# Patient Record
Sex: Male | Born: 1990 | Race: White | Hispanic: No | Marital: Married | Smoking: Never smoker
Health system: Southern US, Community
[De-identification: ages and names within clinical notes are randomized; demographics above are authoritative.]

## PROBLEM LIST (undated history)

## (undated) HISTORY — PX: TONSILLECTOMY: SUR1361

---

## 2016-03-17 ENCOUNTER — Encounter (HOSPITAL_COMMUNITY): Payer: Self-pay | Admitting: *Deleted

## 2016-03-17 ENCOUNTER — Emergency Department (HOSPITAL_COMMUNITY): Payer: Managed Care, Other (non HMO)

## 2016-03-17 ENCOUNTER — Emergency Department (HOSPITAL_COMMUNITY)
Admission: EM | Admit: 2016-03-17 | Discharge: 2016-03-17 | Disposition: A | Discharge status: Home / Self Care | Payer: Managed Care, Other (non HMO) | Attending: Emergency Medicine | Admitting: Emergency Medicine

## 2016-03-17 DIAGNOSIS — Z91040 Latex allergy status: Secondary | ICD-10-CM | POA: Insufficient documentation

## 2016-03-17 DIAGNOSIS — W230XXA Caught, crushed, jammed, or pinched between moving objects, initial encounter: Secondary | ICD-10-CM | POA: Diagnosis not present

## 2016-03-17 DIAGNOSIS — Y939 Activity, unspecified: Secondary | ICD-10-CM | POA: Diagnosis not present

## 2016-03-17 DIAGNOSIS — Y929 Unspecified place or not applicable: Secondary | ICD-10-CM | POA: Diagnosis not present

## 2016-03-17 DIAGNOSIS — Y999 Unspecified external cause status: Secondary | ICD-10-CM | POA: Insufficient documentation

## 2016-03-17 DIAGNOSIS — S61216A Laceration without foreign body of right little finger without damage to nail, initial encounter: Secondary | ICD-10-CM | POA: Insufficient documentation

## 2016-03-17 DIAGNOSIS — S61219A Laceration without foreign body of unspecified finger without damage to nail, initial encounter: Secondary | ICD-10-CM

## 2016-03-17 DIAGNOSIS — S6991XA Unspecified injury of right wrist, hand and finger(s), initial encounter: Secondary | ICD-10-CM | POA: Diagnosis present

## 2016-03-17 MED ORDER — LIDOCAINE HCL (PF) 1 % IJ SOLN
5.0000 mL | Freq: Once | INTRAMUSCULAR | Status: DC
  Filled 2016-03-17: qty 5

## 2016-03-17 NOTE — ED Triage Notes (Signed)
Pt reports injuring right little finger when moving a refrigerator today. Has laceration to anterior finger, bleeding controlled and bandage was applied at an ucc pta. Pt reports able to move digit and denies any numbness but was sent here for further eval and possible hand consult.

## 2016-03-17 NOTE — ED Provider Notes (Signed)
[facility]Y DEPT Provider Note   CSN: [id number] Arrival date & time: 03/17/16  1426     History   Chief Complaint Chief Complaint  Patient presents with  . Hand Injury    HPI Reginald Mack is a 25 y.o. male.  The history is provided by the patient and the spouse.  Hand Pain  This is a new problem. The current episode started 1 to 2 hours ago. The problem occurs constantly. The problem has not changed since onset.The symptoms are aggravated by bending. The symptoms are relieved by rest. He has tried nothing for the symptoms.    History reviewed. No pertinent past medical history.  There are no active problems to display for this patient.   History reviewed. No pertinent surgical history.     Home Medications    Prior to Admission medications   Not on File    Family History History reviewed. No pertinent family history.  Social History Social History  Substance Use Topics  . Smoking status: Never Smoker  . Smokeless tobacco: Not on file  . Alcohol use No     Allergies   Latex   Review of Systems Review of Systems  All other systems reviewed and are negative.    Physical Exam Updated Vital Signs BP 118/57   Pulse 66   Temp 98.1 F (36.7 C) (Oral)   Resp 18   SpO2 100%   Physical Exam  Constitutional: He is oriented to person, place, and time. He appears well-developed and well-nourished. No distress.  Pleasant, cooperative, well-appearing  HENT:  Head: Normocephalic and atraumatic.  Eyes: Conjunctivae are normal. No scleral icterus.  Neck: Neck supple.  Cardiovascular: Intact distal pulses.   2+ right radial pulse  Pulmonary/Chest: Effort normal.  Abdominal: He exhibits no distension.  Musculoskeletal: Normal range of motion. He exhibits no edema, tenderness or deformity.  V-shaped irregular hemostatic laceration through dermis of right palmar 5th digit between PIP and DIP, without Fb, no muscular, nerve, or vessel involvement.  Full intact sensation throughout right hand, including 5th digit. Full intact ROM with medial and lateral movements, flexion/extension at MCP, PIP, and DIP. Brisk cap refill distally.  Neurological: He is alert and oriented to person, place, and time. He exhibits normal muscle tone. Coordination normal.  Skin: Skin is warm and dry. He is not diaphoretic.  Psychiatric: He has a normal mood and affect.  Nursing note and vitals reviewed.    ED Treatments / Results  Labs (all labs ordered are listed, but only abnormal results are displayed) Labs Reviewed - No data to display  EKG  EKG Interpretation None       Radiology Dg Finger Little Right  Result Date: 03/17/2016 CLINICAL DATA:  Slammed finger in refrigerator door EXAM: RIGHT FIFTH FINGER 2+V COMPARISON:  None. FINDINGS: Frontal, oblique, and lateral views were obtained. There is soft tissue injury medial to the fifth PIP joint. No fracture or dislocation. Joint spaces appear normal. No erosive change. IMPRESSION: Soft tissue injury medial to the fifth PIP joint. No fracture or dislocation. No evident arthropathy. Electronically Signed   By: Bretta Bang III M.D.   On: 03/17/2016 17:39    Procedures .Marland KitchenLaceration Repair Date/Time: 03/18/2016 12:59 AM Performed by: Horald Pollen Authorized by: Horald Pollen   Consent:    Consent obtained:  Verbal   Consent given by:  Patient   Risks discussed:  Infection and pain   Alternatives discussed:  No treatment Anesthesia (see MAR for exact dosages):  Anesthesia method:  Nerve block   Block needle gauge:  25 G   Block anesthetic:  Lidocaine 1% w/o epi   Block technique:  Block of 5th digit at base   Block injection procedure:  Anatomic landmarks identified, introduced needle, incremental injection, negative aspiration for blood and anatomic landmarks palpated   Block outcome:  Anesthesia achieved Laceration details:    Location:  Finger   Finger location:  R small  finger   Length (cm):  3   Depth (mm):  0.3 Repair type:    Repair type:  Simple Pre-procedure details:    Preparation:  Patient was prepped and draped in usual sterile fashion Exploration:    Wound extent: no fascia violation noted, no foreign bodies/material noted, no muscle damage noted, no nerve damage noted, no tendon damage noted, no underlying fracture noted and no vascular damage noted   Treatment:    Area cleansed with:  Saline   Amount of cleaning:  Standard   Irrigation solution:  Sterile saline   Irrigation volume:  150cc   Irrigation method:  Syringe and pressure wash Skin repair:    Repair method:  Sutures   Suture size:  4-0   Suture material:  Chromic gut   Suture technique:  Simple interrupted   Number of sutures:  4 Approximation:    Approximation:  Loose Post-procedure details:    Dressing:  Antibiotic ointment and non-adherent dressing   Patient tolerance of procedure:  Tolerated well, no immediate complications   (including critical care time)  Medications Ordered in ED Medications - No data to display   Initial Impression / Assessment and Plan / ED Course  I have reviewed the triage vital signs and the nursing notes.  Pertinent labs & imaging results that were available during my care of the patient were reviewed by me and considered in my medical decision making (see chart for details).  Clinical Course   Reginald Mack is a 25 y.o. male who presented to ED for 5th digit laceration as described above that occurred while moving a refridgerator today. Tetanus UTD (2015). Neuromuscularly intact. XR confirms no fx's. Laceration irrigated. Given return precautions for signs of infection: return for pus draining from wound, for pain in hand, for any new, worse, or concerning symptoms. They demonstrate understanding of this and comfort with d/c home.  Pt condition, course, and discharge were discussed with attending physician Dr. Blane OharaJoshua Zavitz.  Final  Clinical Impressions(s) / ED Diagnoses   Final diagnoses:  Finger laceration, initial encounter    New Prescriptions There are no discharge medications for this patient.    Horald PollenAudrey Audrielle Vankuren, MD 03/18/16 0104    Blane OharaJoshua Zavitz, MD 03/19/16 [phone]

## 2016-03-17 NOTE — ED Notes (Signed)
EDP at bedside  

## 2017-02-16 IMAGING — DX DG FINGER LITTLE 2+V*R*
3 series · 3 of 3 positions shown · non-contrast
Comparison: None.

CLINICAL DATA: Slammed finger in refrigerator door

EXAM:
RIGHT FIFTH FINGER 2+V

[finger ap]
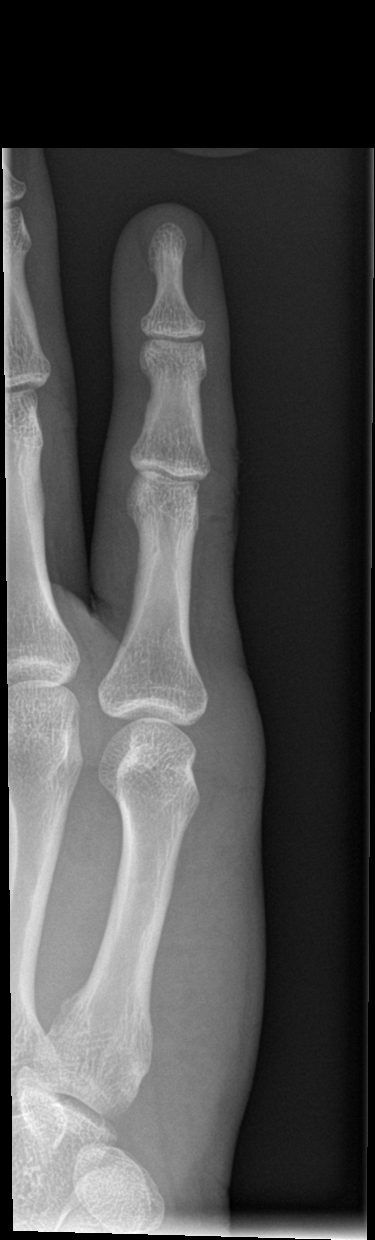

[finger obl]
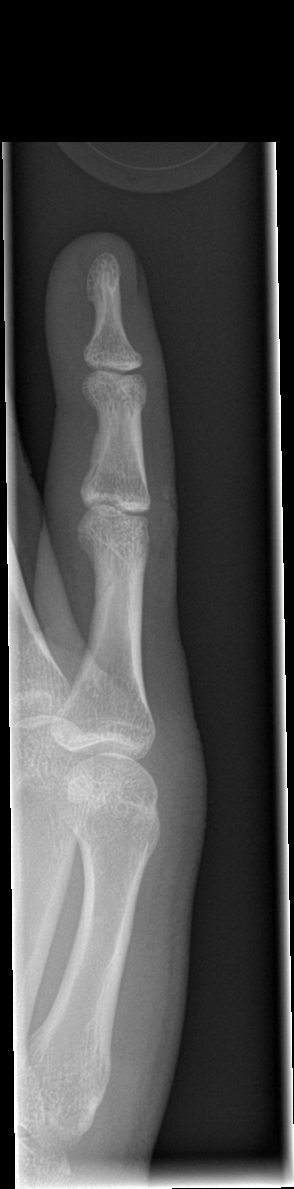

[finger lat]
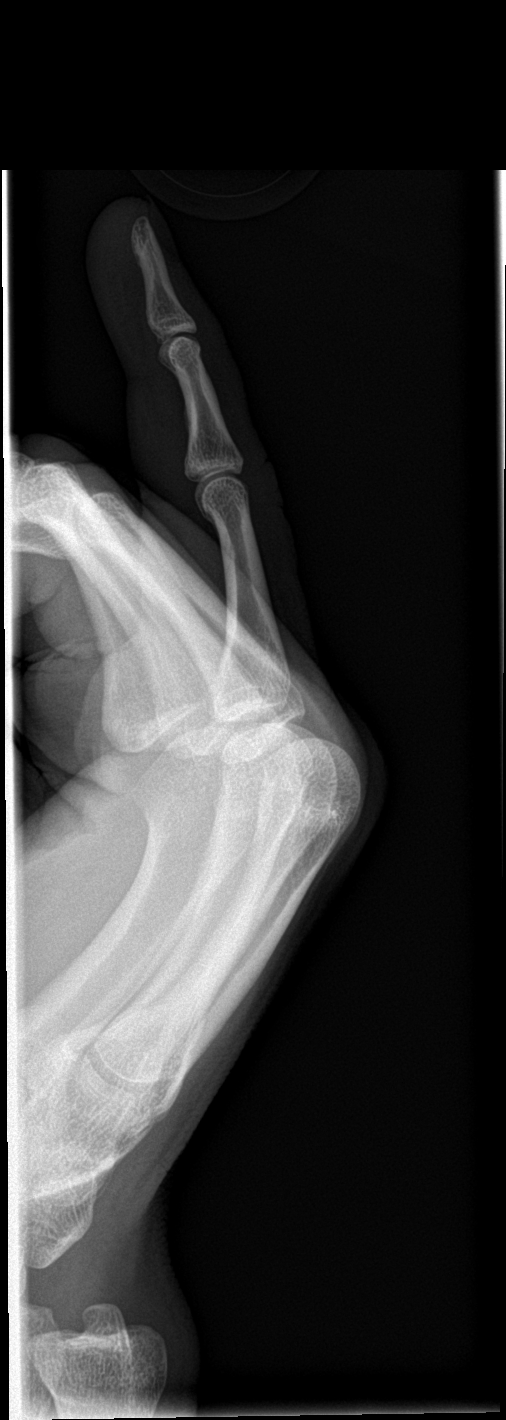

[3 of 3 positions shown; findings below may reference images not displayed]

FINDINGS: Frontal, oblique, and lateral views were obtained. There is soft
tissue injury medial to the fifth PIP joint. No fracture or
dislocation. Joint spaces appear normal. No erosive change.
IMPRESSION: Soft tissue injury medial to the fifth PIP joint. No fracture or
dislocation. No evident arthropathy.

## 2020-12-25 ENCOUNTER — Emergency Department: Admit: 2020-12-25 | Discharge status: Absent without leave | Payer: Self-pay

## 2020-12-25 ENCOUNTER — Emergency Department (INDEPENDENT_AMBULATORY_CARE_PROVIDER_SITE_OTHER)
Admission: EM | Admit: 2020-12-25 | Discharge: 2020-12-25 | Disposition: A | Discharge status: Home / Self Care | Payer: 59 | Source: Home / Self Care | Attending: Family Medicine | Admitting: Family Medicine

## 2020-12-25 ENCOUNTER — Other Ambulatory Visit: Payer: Self-pay

## 2020-12-25 ENCOUNTER — Encounter: Payer: Self-pay | Admitting: Emergency Medicine

## 2020-12-25 DIAGNOSIS — R6889 Other general symptoms and signs: Secondary | ICD-10-CM

## 2020-12-25 NOTE — ED Provider Notes (Signed)
[facility]    CSN: [id number] Arrival date & time: 12/25/20  4827      History   Chief Complaint Chief Complaint  Patient presents with   Sore Throat   Cough    HPI Reginald Mack is a 30 y.o. male.   HPI Patient has 3 small children.  The oldest son was exposed to influenza and was sick with a virus.  They did not get him tested.  The middle child was sick for just a day or 2 but not terribly.  He and his wife both have come down with symptoms.  He has runny nose and sore throat and cough.  Body aches.  Fever.  He is immunized against COVID.  He has done 2 home COVID test that are negative History reviewed. No pertinent past medical history.  There are no problems to display for this patient.   Past Surgical History:  Procedure Laterality Date   TONSILLECTOMY         Home Medications    Prior to Admission medications   Not on File    Family History Family History  Problem Relation Age of Onset   Healthy Mother    Healthy Father     Social History Social History   Tobacco Use   Smoking status: Never   Smokeless tobacco: Never  Substance Use Topics   Alcohol use: No   Drug use: No     Allergies   Latex   Review of Systems Review of Systems See HPI  Physical Exam Triage Vital Signs ED Triage Vitals  Enc Vitals Group     BP 12/25/20 1003 131/84     Pulse Rate 12/25/20 1003 81     Resp 12/25/20 1003 15     Temp 12/25/20 1003 99.1 F (37.3 C)     Temp Source 12/25/20 1003 Oral     SpO2 12/25/20 1003 98 %     Weight 12/25/20 1004 215 lb (97.5 kg)     Height 12/25/20 1004 6\' 2"  (1.88 m)     Head Circumference --      Peak Flow --      Pain Score 12/25/20 1004 0     Pain Loc --      Pain Edu? --      Excl. in GC? --    No data found.  Updated Vital Signs BP 131/84 (BP Location: Right Arm)   Pulse 81   Temp 99.1 F (37.3 C) (Oral)   Resp 15   Ht 6\' 2"  (1.88 m)   Wt 97.5 kg   SpO2 98%   BMI 27.60 kg/m       Physical Exam Constitutional:      General: He is not in acute distress.    Appearance: Normal appearance. He is well-developed.  HENT:     Head: Normocephalic and atraumatic.     Right Ear: Tympanic membrane and ear canal normal.     Left Ear: Tympanic membrane and ear canal normal.     Nose: Congestion present.     Mouth/Throat:     Pharynx: Posterior oropharyngeal erythema present.     Comments: There is erythema of the posterior pharynx with clear postnasal drip, no tonsillar pillar swelling or redness Eyes:     Conjunctiva/sclera: Conjunctivae normal.     Pupils: Pupils are equal, round, and reactive to light.  Cardiovascular:     Rate and Rhythm: Normal rate and regular rhythm.  Heart sounds: Normal heart sounds.  Pulmonary:     Effort: Pulmonary effort is normal. No respiratory distress.     Breath sounds: Normal breath sounds.  Abdominal:     General: There is no distension.     Palpations: Abdomen is soft.  Musculoskeletal:        General: Normal range of motion.     Cervical back: Normal range of motion.  Lymphadenopathy:     Cervical: Cervical adenopathy present.  Skin:    General: Skin is warm and dry.  Neurological:     Mental Status: He is alert.  Psychiatric:        Mood and Affect: Mood normal.     UC Treatments / Results  Labs (all labs ordered are listed, but only abnormal results are displayed) Labs Reviewed  COVID-19, FLU A+B NAA    EKG   Radiology No results found.  Procedures Procedures (including critical care time)  Medications Ordered in UC Medications - No data to display  Initial Impression / Assessment and Plan / UC Course  I have reviewed the triage vital signs and the nursing notes.  Pertinent labs & imaging results that were available during my care of the patient were reviewed by me and considered in my medical decision making (see chart for details).     Discussed likely viral illness.  Swab is done.  Home care  reviewed. Final Clinical Impressions(s) / UC Diagnoses   Final diagnoses:  Flu-like symptoms     Discharge Instructions      Home to rest.  Push fluids Ibuprofen or Tylenol for pain and fever Use a sore throat lozenges or spray.  Over-the-counter cough and cold medicines are appropriate if needed Check MyChart for your test results   ED Prescriptions   None    PDMP not reviewed this encounter.   Eustace Moore, MD 12/25/20 1044

## 2020-12-25 NOTE — Discharge Instructions (Addendum)
Home to rest.  Push fluids Ibuprofen or Tylenol for pain and fever Use a sore throat lozenges or spray.  Over-the-counter cough and cold medicines are appropriate if needed Check MyChart for your test results

## 2020-12-25 NOTE — ED Triage Notes (Signed)
Cough, sore throat, headache since sat morning  Negative Home COVID test on sat & sun evening Wife is also sick w/ same symptoms COVID 10/21 COVID vaccine 2/22 ( only 1 shot )

## 2020-12-27 LAB — COVID-19, FLU A+B NAA
Influenza A, NAA: NOT DETECTED
Influenza B, NAA: NOT DETECTED
SARS-CoV-2, NAA: NOT DETECTED

## 2024-05-04 ENCOUNTER — Ambulatory Visit: Admitting: Urology

## 2024-05-04 ENCOUNTER — Encounter: Payer: Self-pay | Admitting: Urology

## 2024-05-04 VITALS — BP 120/86 | HR 74 | Ht 74.0 in | Wt 215.0 lb

## 2024-05-04 DIAGNOSIS — Z3009 Encounter for other general counseling and advice on contraception: Secondary | ICD-10-CM | POA: Diagnosis not present

## 2024-05-04 MED ORDER — DIAZEPAM 10 MG PO TABS: ORAL_TABLET | ORAL | 0 refills | Status: AC

## 2024-05-04 NOTE — Progress Notes (Signed)
 05/04/2024 8:19 AM   Norman Asp Jun 23, 1991 [id number]  Referring provider: No referring provider defined for this encounter.  Chief Complaint  Patient presents with   VAS Consult    HPI: Reginald Mack is a 33 y.o. male who presents for vasectomy counseling.  Married with 4 children Denies prior history urologic problems including chronic scrotal pain, epididymitis or orchitis No previous history inguinal hernia or pelvic surgery No history of bleeding or clotting disorders   PMH: No past medical history on file.  Surgical History: Past Surgical History:  Procedure Laterality Date   TONSILLECTOMY      Home Medications:  Allergies as of 05/04/2024       Reactions   Latex         Medication List    as of May 04, 2024  8:19 AM   You have not been prescribed any medications.     Allergies:  Allergies  Allergen Reactions   Latex     Family History: Family History  Problem Relation Age of Onset   Healthy Mother    Healthy Father     Social History:  reports that he has never smoked. He has never used smokeless tobacco. He reports that he does not drink alcohol and does not use drugs.   Physical Exam: BP 120/86   Pulse 74   Ht 6' 2 (1.88 m)   Wt 215 lb (97.5 kg)   BMI 27.60 kg/m   Constitutional:  Alert and oriented, No acute distress. HEENT: [place] AT Respiratory: Normal respiratory effort, no increased work of breathing. GI: Abdomen is soft, nontender, nondistended, no abdominal masses GU: Phallus without lesions, testes descended bilaterally without masses or tenderness, spermatic cord/epididymis palpably normal bilaterally.  Vasa palpable bilaterally Psychiatric: Normal mood and affect.   Assessment & Plan:    1.  Undesired fertility Desires to schedule vasectomy We had a long discussion about vasectomy. We specifically discussed the procedure, recovery and the risks, benefits and alternatives of vasectomy. I explained that  the procedure entails removal of a segment of each vas deferens, each of which conducts sperm, and that the purpose of this procedure is to cause sterility (inability to produce children or cause pregnancy). Vasectomy is intended to be permanent and irreversible form of contraception. Options for fertility after vasectomy include vasectomy reversal, or sperm retrieval with in vitro fertilization. These options are not always successful, and they may be expensive. We discussed reversible forms of birth control such as condoms, IUD or diaphragms, as well as the option of freezing sperm in a sperm bank prior to the vasectomy procedure. We discussed the importance of avoiding strenuous exercise for four days after vasectomy, and the importance of refraining from any form of ejaculation for seven days after vasectomy. I explained that vasectomy does not produce immediate sterility so another form of contraceptive must be used until sterility is assured by having semen checked for sperm. Thus, a post vasectomy semen analysis is necessary to confirm sterility. Rarely, vasectomy must be repeated. We discussed the approximately 1 in 2,000 risk of pregnancy after vasectomy for men who have post-vasectomy semen analysis showing absent sperm or rare non-motile sperm. Typical side effects include a small amount of oozing blood, some discomfort and mild swelling in the area of incision.  Vasectomy does not affect sexual performance, function, please, sensation, interest, desire, satisfaction, penile erection, volume of semen or ejaculation. Other rare risks include allergy or adverse reaction to an anesthetic, testicular atrophy, hematoma, infection/abscess,  prolonged tenderness of the vas deferens, pain, swelling, painful nodule or scar (called sperm granuloma) or epididymtis. We discussed chronic testicular pain syndrome. This has been reported to occur in as many as 1-2% of men and may be permanent. This can be treated with  medication, small procedures or (rarely) surgery. Valium 10 mg as a preprocedure anxiolytic sent to pharmacy and he was informed he would need a driver if utilizing this medication   Glendia JAYSON Barba, MD  [facility] [address], [address] [place], [place] [address] [phone]

## 2024-05-04 NOTE — Patient Instructions (Signed)
 Pre-Vasectomy Instructions  STOP all aspirin or blood thinners (Aspirin, Plavix, Coumadin, Warfarin, Motrin, Ibuprofen, Advil, Aleve, Naproxen, Naprosyn) for 7 days prior to the procedure.  If you have any questions about stopping these medications please contact your primary care physician or cardiologist.  Shave all hair from the upper scrotum on the day of the procedure.  This means just under the penis onto the scrotal sac.  The area shaved should measure about 2-3 inches around.  You may lather the scrotum with soap and water, and shave with a safety razor.  After shaving the area, thoroughly wash the penis and the scrotum, then shower or bathe to remove all the loose hairs.  If needed, wash the area again just before coming in for your Vasectomy.  It is recommended to have a light meal an hour or so prior to the procedure.  Bring a scrotal support (jock strap or suspensory, or tight jockey shorts or underwear).  Wear comfortable pants or shorts.  While the actual procedure usually takes about 45 minutes, you should be prepared to stay in the office for approximately one hour.  Bring someone with you to drive you home.  If you have any questions or concerns, please feel free to call the office at [phone].

## 2024-05-31 ENCOUNTER — Encounter: Admitting: Urology

## 2024-06-08 ENCOUNTER — Ambulatory Visit (INDEPENDENT_AMBULATORY_CARE_PROVIDER_SITE_OTHER): Admitting: Urology

## 2024-06-08 VITALS — BP 137/83 | HR 88 | Ht 76.0 in | Wt 215.0 lb

## 2024-06-08 DIAGNOSIS — Z9852 Vasectomy status: Secondary | ICD-10-CM

## 2024-06-08 NOTE — Progress Notes (Signed)
° °  Vasectomy Procedure Note  Indications: Reginald Mack is a 33 y.o. male who presents today for elective sterilization.  He has been consented for the procedure.  He is aware of the risks and benefits.  He had no additional questions.  He agrees to proceed.  He denies any other significant change since his last visit.  Pre-operative Diagnosis: Elective sterilization  Post-operative Diagnosis: Elective sterilization  Premedication: Valium  10 mg po  Surgeon: Glendia C. Mikailah Morel, M.D  Description: The patient was prepped and draped in the standard fashion.  The right vas deferens was identified and brought superiorly to the anterior scrotal skin.  The skin and vas were then anesthetized utilizing 5 ml 1% lidocaine .  A small stab incision was made and spread with the vas dissector.  The vas was grasped utilizing the vas clamp and elevated out of the incision. The vas sheath was incised and the vas was dissected out of the sheath, elevated and dissected free from the surrounding tissue and vessels.  A 1 cm segment was excised. The vas lumens were cauterized with a needle tip electrocautery for a distance of at least 1 cm.  Fascial interposition was performed on the testicular end with a figure-of-eight 3-0 chromic suture.   No significant bleeding was observed.  The vas ends were then dropped back into the hemiscrotum.  The skin was closed with hemostatic pressure.  An identical procedure was performed on the contralateral side.  Clean dry gauze was applied to the incision sites.  The patient tolerated the procedure well.  Complications:None  Recommendations: 1.  No lifting greater than 10 pounds or strenuous activity for 1 week. 2.  Scrotal support for 1-2 weeks. 3.  May shower in 24 hours; no bath, hot tub for 1 week 4.  No intercourse for at least 7 days and resume based on level of discomfort  5.  Continue alternate contraception for 12 weeks.  6.  Call for significant pain, swelling,  redness, drainage or fever greater than 100.5. 7.  Ibuprofen/acetaminophen as needed pain 8.  Follow-up semen analysis in 12 weeks.   Glendia Barba, MD

## 2024-06-08 NOTE — Patient Instructions (Signed)

## 2024-09-06 ENCOUNTER — Other Ambulatory Visit
